# Patient Record
Sex: Female | Born: 1995 | Race: White | Hispanic: No | Marital: Single | Smoking: Never smoker
Health system: Southern US, Community
[De-identification: ages and names within clinical notes are randomized; demographics above are authoritative.]

## PROBLEM LIST (undated history)

## (undated) DIAGNOSIS — F419 Anxiety disorder, unspecified: Secondary | ICD-10-CM

## (undated) HISTORY — PX: CARDIAC SURGERY: SHX584

---
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
## 2016-02-13 ENCOUNTER — Ambulatory Visit (HOSPITAL_COMMUNITY)
Admission: EM | Admit: 2016-02-13 | Discharge: 2016-02-13 | Disposition: A | Discharge status: Home / Self Care | Payer: Medicaid Other | Attending: Emergency Medicine | Admitting: Emergency Medicine

## 2016-02-13 ENCOUNTER — Encounter (HOSPITAL_COMMUNITY): Payer: Self-pay | Admitting: *Deleted

## 2016-02-13 ENCOUNTER — Emergency Department (HOSPITAL_COMMUNITY)
Admission: EM | Admit: 2016-02-13 | Discharge: 2016-02-13 | Disposition: A | Discharge status: Home / Self Care | Payer: Medicaid Other

## 2016-02-13 DIAGNOSIS — R1114 Bilious vomiting: Secondary | ICD-10-CM

## 2016-02-13 DIAGNOSIS — Z789 Other specified health status: Secondary | ICD-10-CM

## 2016-02-13 DIAGNOSIS — E860 Dehydration: Secondary | ICD-10-CM

## 2016-02-13 MED ORDER — SODIUM CHLORIDE 0.9 % IV BOLUS (SEPSIS)
1000.0000 mL | Freq: Once | INTRAVENOUS | Status: AC
  Administered 2016-02-13: 1000 mL via INTRAVENOUS

## 2016-02-13 MED ORDER — ONDANSETRON HCL 4 MG/2ML IJ SOLN
INTRAMUSCULAR | Status: AC
  Filled 2016-02-13: qty 2

## 2016-02-13 MED ORDER — ONDANSETRON HCL 4 MG PO TABS: 4.0000 mg | ORAL_TABLET | Freq: Four times a day (QID) | ORAL | 0 refills | Status: DC | PRN

## 2016-02-13 MED ORDER — ONDANSETRON HCL 4 MG/2ML IJ SOLN
4.0000 mg | Freq: Once | INTRAMUSCULAR | Status: AC
  Administered 2016-02-13: 4 mg via INTRAVENOUS

## 2016-02-13 NOTE — ED Notes (Signed)
Called for triage, still no answer.

## 2016-02-13 NOTE — ED Triage Notes (Signed)
Pt    Reports      Last   5-6    Cocktails    Last  Pm

## 2016-02-13 NOTE — ED Provider Notes (Signed)
CSN: [id number]     Arrival date & time 02/13/16  1043 History   First MD Initiated Contact with Patient 02/13/16 1211     Chief Complaint  Patient presents with  . Emesis   (Consider location/radiation/quality/duration/timing/severity/associated sxs/prior Treatment) 20 year old female brought in by her friend with concern over persistent vomiting since last evening. She consumed possibly 5 to 6 mixed cocktails/alcohol (or more) last night and started vomiting shortly after. She reports she has vomited over 15 times now. She has been shaking and now states her fingers and extremities feel numb. She had a headache last evening and took Excedrin Migraine with relief. No other friends or family members are ill. Denies any fever or diarrhea. No other chronic health issues. Takes no daily medication.    The history is provided by the patient and a friend.    History reviewed. No pertinent past medical history. History reviewed. No pertinent surgical history. History reviewed. No pertinent family history. Social History  Substance Use Topics  . Smoking status: Never Smoker  . Smokeless tobacco: Never Used  . Alcohol use Yes   OB History    No data available     Review of Systems  Constitutional: Positive for appetite change, chills and fatigue. Negative for fever.  HENT: Negative for congestion.   Respiratory: Negative for cough, chest tightness and shortness of breath.   Cardiovascular: Negative for chest pain.  Gastrointestinal: Positive for abdominal pain, nausea and vomiting. Negative for diarrhea.  Genitourinary: Positive for decreased urine volume. Negative for difficulty urinating.  Musculoskeletal: Positive for arthralgias. Negative for neck pain and neck stiffness.  Skin: Negative for rash.  Neurological: Positive for weakness, numbness (in extremities) and headaches. Negative for dizziness and syncope.  Hematological: Negative for adenopathy.    Allergies  Patient has no  known allergies.  Home Medications   Prior to Admission medications   Medication Sig Start Date End Date Taking? Authorizing Provider  ondansetron (ZOFRAN) 4 MG tablet Take 1 tablet (4 mg total) by mouth every 6 (six) hours as needed for nausea or vomiting. 02/13/16   Sudie GrumblingAnn Berry Jawara Latorre, NP   Meds Ordered and Administered this Visit   Medications  sodium chloride 0.9 % bolus 1,000 mL (1,000 mLs Intravenous Given 02/13/16 1251)  ondansetron (ZOFRAN) injection 4 mg (4 mg Intravenous Given 02/13/16 1251)    BP 129/81 (BP Location: Right Arm)   Pulse 89   Temp 97 F (36.1 C)   Resp 16   SpO2 100%  No data found.   Physical Exam  Constitutional: She is oriented to person, place, and time. She appears well-developed and well-nourished. She appears lethargic. She has a sickly appearance.  She is curled up in fetal position due to abdominal cramping and vomiting.   HENT:  Head: Normocephalic and atraumatic.  Nose: Nose normal.  Mouth/Throat: Uvula is midline. Mucous membranes are dry. No posterior oropharyngeal edema or posterior oropharyngeal erythema.  Neck: Normal range of motion. Neck supple.  Cardiovascular: Regular rhythm and normal heart sounds.  Tachycardia present.   Pulmonary/Chest: Effort normal and breath sounds normal. Tachypnea noted.  Abdominal: Soft. Bowel sounds are normal. There is generalized tenderness. There is no rigidity, no rebound, no guarding and no CVA tenderness.  Lymphadenopathy:    She has no cervical adenopathy.  Neurological: She is oriented to person, place, and time. She appears lethargic.  Skin: Capillary refill takes less than 2 seconds. There is pallor.  Psychiatric: She has a normal mood  and affect. Her speech is normal. Thought content normal. She is slowed.    Urgent Care Course   Clinical Course     Procedures (including critical care time)  Labs Review Labs Reviewed - No data to display  Imaging Review No results found.   Visual  Acuity Review  Right Eye Distance:   Left Eye Distance:   Bilateral Distance:    Right Eye Near:   Left Eye Near:    Bilateral Near:         MDM   1. Bilious vomiting with nausea   2. Dehydration   3. Alcohol ingestion    Discussed with friend and patient that she will need IV fluids for rehydration as well as anti-nausea medication. They refuse to go to ER. Will trial 1L 0.9% NaCl bolus and Zofran 4mg  IV here. After 45 minutes, she was feeling better. No longer tachycardic and her peripheral numbness had resolved. She had not vomited in 60 minutes. Recommend slowly increase fluid intake with fluids that have sugar and electrolytes in them. No solid foods today. May continue Zofran 4mg  every 8 hours as needed for nausea and vomiting. Discussed moderation in alcohol consumption. If unable to keep liquids down, go to ER for further evaluation.    Sudie GrumblingAnn Berry Atilla Zollner, NP 02/14/16 1121

## 2016-02-13 NOTE — ED Notes (Signed)
Called for triage x3, no response. 

## 2016-02-13 NOTE — ED Notes (Addendum)
PT instructed to report to ED for uncontrollable vomiting, fever, and /or specific abdominal pain. Pt has not vomited since Zofran was administered.

## 2016-02-13 NOTE — ED Triage Notes (Signed)
Pt  Reports   Vomiting   Cramping     All  Over  Body    continous  Vomiting          Pt  Reports  Fingers  Are      Numb

## 2016-02-13 NOTE — Discharge Instructions (Signed)
You were given IV fluids to help rehydrate you today as well as Zofran to help with vomiting. Recommend slowly increase fluid intake and consume fluids that have sugar, sodium and potassium in them. No solid food today. Make continue Zofran 4mg  every 6 hours as needed for nausea and vomiting. If unable to keep fluids down, go to ER for further evaluation.

## 2016-10-16 ENCOUNTER — Emergency Department (HOSPITAL_COMMUNITY): Payer: Self-pay

## 2016-10-16 ENCOUNTER — Emergency Department (HOSPITAL_COMMUNITY)
Admission: EM | Admit: 2016-10-16 | Discharge: 2016-10-16 | Disposition: A | Discharge status: Home / Self Care | Payer: Self-pay | Attending: Emergency Medicine | Admitting: Emergency Medicine

## 2016-10-16 DIAGNOSIS — M549 Dorsalgia, unspecified: Secondary | ICD-10-CM | POA: Insufficient documentation

## 2016-10-16 DIAGNOSIS — R0789 Other chest pain: Secondary | ICD-10-CM | POA: Insufficient documentation

## 2016-10-16 DIAGNOSIS — R1110 Vomiting, unspecified: Secondary | ICD-10-CM | POA: Insufficient documentation

## 2016-10-16 DIAGNOSIS — R0600 Dyspnea, unspecified: Secondary | ICD-10-CM | POA: Insufficient documentation

## 2016-10-16 DIAGNOSIS — Z79899 Other long term (current) drug therapy: Secondary | ICD-10-CM | POA: Insufficient documentation

## 2016-10-16 LAB — COMPREHENSIVE METABOLIC PANEL
ALT: 13 U/L — ABNORMAL LOW (ref 14–54)
ANION GAP: 13 (ref 5–15)
AST: 29 U/L (ref 15–41)
Albumin: 4.9 g/dL (ref 3.5–5.0)
Alkaline Phosphatase: 56 U/L (ref 38–126)
BILIRUBIN TOTAL: 1.2 mg/dL (ref 0.3–1.2)
BUN: 7 mg/dL (ref 6–20)
CO2: 17 mmol/L — ABNORMAL LOW (ref 22–32)
Calcium: 9.4 mg/dL (ref 8.9–10.3)
Chloride: 107 mmol/L (ref 101–111)
Creatinine, Ser: 0.58 mg/dL (ref 0.44–1.00)
GFR calc non Af Amer: >60 mL/min (ref >60)
GLUCOSE: 107 mg/dL — AB (ref 65–99)
POTASSIUM: 3.4 mmol/L — AB (ref 3.5–5.1)
Sodium: 137 mmol/L (ref 135–145)
TOTAL PROTEIN: 7.9 g/dL (ref 6.5–8.1)

## 2016-10-16 LAB — CBC WITH DIFFERENTIAL/PLATELET
BASOS ABS: 0 10*3/uL (ref 0.0–0.1)
Basophils Relative: 0 %
Eosinophils Absolute: 0 10*3/uL (ref 0.0–0.7)
Eosinophils Relative: 0 %
HEMATOCRIT: 39.3 % (ref 36.0–46.0)
HEMOGLOBIN: 14.1 g/dL (ref 12.0–15.0)
Lymphocytes Relative: 5 %
Lymphs Abs: 0.6 10*3/uL — ABNORMAL LOW (ref 0.7–4.0)
MCH: 29.4 pg (ref 26.0–34.0)
MCHC: 35.9 g/dL (ref 30.0–36.0)
MCV: 81.9 fL (ref 78.0–100.0)
Monocytes Absolute: 0.4 10*3/uL (ref 0.1–1.0)
Monocytes Relative: 4 %
NEUTROS ABS: 11.1 10*3/uL — AB (ref 1.7–7.7)
NEUTROS PCT: 91 %
PLATELETS: 214 10*3/uL (ref 150–400)
RBC: 4.8 MIL/uL (ref 3.87–5.11)
RDW: 13.1 % (ref 11.5–15.5)
WBC: 12.2 10*3/uL — AB (ref 4.0–10.5)

## 2016-10-16 LAB — URINALYSIS, COMPLETE (UACMP) WITH MICROSCOPIC
Bacteria, UA: NONE SEEN
Bilirubin Urine: NEGATIVE
GLUCOSE, UA: NEGATIVE mg/dL
Ketones, ur: 20 mg/dL — AB
Leukocytes, UA: NEGATIVE
NITRITE: NEGATIVE
PH: 8 (ref 5.0–8.0)
PROTEIN: NEGATIVE mg/dL
SPECIFIC GRAVITY, URINE: 1.024 (ref 1.005–1.030)
WBC UA: NONE SEEN WBC/hpf (ref 0–5)

## 2016-10-16 LAB — TROPONIN I

## 2016-10-16 LAB — LIPASE, BLOOD: LIPASE: 24 U/L (ref 11–51)

## 2016-10-16 LAB — CBG MONITORING, ED: GLUCOSE-CAPILLARY: 105 mg/dL — AB (ref 65–99)

## 2016-10-16 LAB — LACTIC ACID, PLASMA: Lactic Acid, Venous: 4 mmol/L (ref 0.5–1.9)

## 2016-10-16 LAB — I-STAT BETA HCG BLOOD, ED (MC, WL, AP ONLY): I-stat hCG, quantitative: <5 m[IU]/mL (ref <5)

## 2016-10-16 MED ORDER — SODIUM CHLORIDE 0.9 % IV BOLUS (SEPSIS)
1000.0000 mL | Freq: Once | INTRAVENOUS | Status: AC
  Administered 2016-10-16: 1000 mL via INTRAVENOUS

## 2016-10-16 MED ORDER — PROMETHAZINE HCL 25 MG PO TABS: 25.0000 mg | ORAL_TABLET | Freq: Four times a day (QID) | ORAL | 0 refills | Status: DC | PRN

## 2016-10-16 MED ORDER — IOPAMIDOL (ISOVUE-300) INJECTION 61%
INTRAVENOUS | Status: AC
  Administered 2016-10-16: 75 mL via INTRAVENOUS
  Filled 2016-10-16: qty 75

## 2016-10-16 MED ORDER — PROCHLORPERAZINE EDISYLATE 5 MG/ML IJ SOLN
10.0000 mg | Freq: Once | INTRAMUSCULAR | Status: AC
  Administered 2016-10-16: 10 mg via INTRAVENOUS
  Filled 2016-10-16: qty 2

## 2016-10-16 MED ORDER — FENTANYL CITRATE (PF) 100 MCG/2ML IJ SOLN
50.0000 ug | Freq: Once | INTRAMUSCULAR | Status: AC
  Administered 2016-10-16: 50 ug via INTRAVENOUS
  Filled 2016-10-16: qty 2

## 2016-10-16 MED ORDER — SODIUM CHLORIDE 0.9 % IV BOLUS (SEPSIS): 1000.0000 mL | Freq: Once | INTRAVENOUS | Status: DC

## 2016-10-16 MED ORDER — IOPAMIDOL (ISOVUE-300) INJECTION 61%
INTRAVENOUS | Status: AC
  Filled 2016-10-16: qty 30

## 2016-10-16 NOTE — ED Notes (Signed)
Meal and po challenge complete

## 2016-10-16 NOTE — ED Notes (Signed)
PT IN CT

## 2016-10-16 NOTE — ED Notes (Signed)
Patient transported to x-ray. ?

## 2016-10-16 NOTE — ED Notes (Signed)
AWARE OF NEED FOR URINE 

## 2016-10-16 NOTE — ED Triage Notes (Signed)
Per mother, patient started vomiting this morning and started having chest and back pain and now she is short of breath. She has vomiting 30+ times today.

## 2016-10-16 NOTE — ED Notes (Signed)
Patient aware of urine specimen.

## 2016-10-16 NOTE — ED Notes (Signed)
ED Provider at bedside. 

## 2016-10-16 NOTE — ED Provider Notes (Signed)
[facility]-EMERGENCY DEPT Provider Note   CSN: [id number] Arrival date & time: 10/16/16  1614     History   Chief Complaint Chief Complaint  Patient presents with  . Shortness of Breath  . Chest Pain  . Back Pain  . Emesis    HPI Janasha A Lawernce IonCranford is a 21 y.o. female.  HPI   21 year old female without a known past medical history aside from some type of heart surgery when she was younger. She started having nausea and vomiting earlier today. This progressed throughout the day with one episode of diarrhea. After multiple episodes of vomiting started having chest pain and back pain with shortness of breath. No fevers. She did eat out last night and had a couple drinks but was not intoxicated as mom was with her all night. Her boyfriend ate the same proceeded and has not been sick. She's not been sick like this before.  No urinary symptoms, no other drugs. No other associated or modifying symptoms.  No past medical history on file.  There are no active problems to display for this patient.   No past surgical history on file.  OB History    No data available       Home Medications    Prior to Admission medications   Medication Sig Start Date End Date Taking? Authorizing Provider  cetirizine (ZYRTEC) 10 MG tablet Take 10 mg by mouth daily as needed for allergies.   Yes [provider]  diphenhydrAMINE (BENADRYL) 25 MG tablet Take 25-50 mg by mouth every 6 (six) hours as needed for allergies.   Yes [provider]  ondansetron (ZOFRAN) 4 MG tablet Take 1 tablet (4 mg total) by mouth every 6 (six) hours as needed for nausea or vomiting. 02/13/16  Yes Amyot, Ali LoweAnn Berry, NP  promethazine (PHENERGAN) 25 MG tablet Take 1 tablet (25 mg total) by mouth every 6 (six) hours as needed for nausea or vomiting. 10/16/16   Bracen Schum, Barbara CowerJason, MD    Family History No family history on file.  Social History Social History  Substance Use Topics  . Smoking status: Never Smoker    . Smokeless tobacco: Never Used  . Alcohol use Yes     Allergies   Patient has no known allergies.   Review of Systems Review of Systems  All other systems reviewed and are negative.    Physical Exam Updated Vital Signs BP 107/61   Pulse 73   Temp 97.8 F (36.6 C) (Oral)   Resp 15   LMP 10/16/2016   SpO2 97%   Physical Exam  Constitutional: She is oriented to person, place, and time. She appears well-developed and well-nourished. She appears distressed.  HENT:  Head: Normocephalic and atraumatic.  Eyes: Conjunctivae and EOM are normal.  Neck: Normal range of motion.  Cardiovascular: Normal rate and regular rhythm.   Pulmonary/Chest: No stridor. Tachypnea noted. No respiratory distress. She has no wheezes. She has no rales.  Abdominal: Soft. She exhibits no distension.  Neurological: She is alert and oriented to person, place, and time.  Skin: Skin is warm and dry.  Nursing note and vitals reviewed.    ED Treatments / Results  Labs (all labs ordered are listed, but only abnormal results are displayed) Labs Reviewed  COMPREHENSIVE METABOLIC PANEL - Abnormal; Notable for the following:       Result Value   Potassium 3.4 (*)    CO2 17 (*)    Glucose, Bld 107 (*)    ALT  13 (*)    All other components within normal limits  CBC WITH DIFFERENTIAL/PLATELET - Abnormal; Notable for the following:    WBC 12.2 (*)    Neutro Abs 11.1 (*)    Lymphs Abs 0.6 (*)    All other components within normal limits  URINALYSIS, COMPLETE (UACMP) WITH MICROSCOPIC - Abnormal; Notable for the following:    APPearance CLOUDY (*)    Hgb urine dipstick SMALL (*)    Ketones, ur 20 (*)    Squamous Epithelial / LPF 0-5 (*)    All other components within normal limits  LACTIC ACID, PLASMA - Abnormal; Notable for the following:    Lactic Acid, Venous 4.0 (*)    All other components within normal limits  CBG MONITORING, ED - Abnormal; Notable for the following:    Glucose-Capillary 105  (*)    All other components within normal limits  LIPASE, BLOOD  TROPONIN I  I-STAT BETA HCG BLOOD, ED ([facility], [facility], [facility] ONLY)    EKG  EKG Interpretation  Date/Time:  Tuesday October 16 2016 16:40:23 EDT Ventricular Rate:  77 PR Interval:    QRS Duration: 96 QT Interval:  431 QTC Calculation: 488 R Axis:   74 Text Interpretation:  Sinus rhythm Borderline repolarization abnormality Borderline prolonged QT interval No old tracing to compare Confirmed by Marily Memos [phone]) on 10/16/2016 5:38:29 PM       Radiology Dg Chest 2 View  Result Date: 10/16/2016 CLINICAL DATA:  21 year old female with chest pain, abdomen pain, back pain and shortness of breath. EXAM: CHEST  2 VIEW COMPARISON:  02/18/2007 and earlier. FINDINGS: Semi upright AP and lateral views of the chest. Large lung volumes which could reflect good inspiratory effort or hyperinflation. Mediastinal contours are stable and within normal limits. Visualized tracheal air column is within normal limits. No pneumothorax, pulmonary edema, pleural effusion or confluent pulmonary opacity. Mild dextroconvex thoracic curvature. No acute osseous abnormality identified. Negative visible bowel gas pattern. No pneumoperitoneum identified. IMPRESSION: Negative aside from suggestion of pulmonary hyperinflation. Electronically Signed   By: Odessa Fleming M.D.   On: 10/16/2016 18:08   Ct Abdomen Pelvis W Contrast  Result Date: 10/16/2016 CLINICAL DATA:  Multiple episodes of vomiting today. Chest and back pain and shortness of breath. EXAM: CT ABDOMEN AND PELVIS WITH CONTRAST TECHNIQUE: Multidetector CT imaging of the abdomen and pelvis was performed using the standard protocol following bolus administration of intravenous contrast. CONTRAST:  75 cc ISOVUE-300 IOPAMIDOL (ISOVUE-300) INJECTION 61% COMPARISON:  Pelvic ultrasound dated 12/18/2011. FINDINGS: Lower chest: Unremarkable. Hepatobiliary: No focal liver abnormality is seen. No gallstones, gallbladder wall  thickening, or biliary dilatation. Pancreas: Unremarkable. No pancreatic ductal dilatation or surrounding inflammatory changes. Spleen: Normal in size without focal abnormality. Adrenals/Urinary Tract: Adrenal glands are unremarkable. Kidneys are normal, without renal calculi, focal lesion, or hydronephrosis. Bladder is unremarkable. Stomach/Bowel: Stomach is within normal limits. Appendix appears normal. No evidence of bowel wall thickening, distention, or inflammatory changes. Vascular/Lymphatic: No significant vascular findings are present. No enlarged abdominal or pelvic lymph nodes. Reproductive: Uterus and bilateral adnexa are unremarkable. Tampon in the vagina. Other: Trace amount of free peritoneal fluid, within normal limits of physiological fluid. Musculoskeletal: Mild levoconvex thoracolumbar scoliosis. IMPRESSION: No acute abnormality. Electronically Signed   By: Beckie Salts M.D.   On: 10/16/2016 19:38    Procedures Procedures (including critical care time)  Medications Ordered in ED Medications  iopamidol (ISOVUE-300) 61 % injection (not administered)  sodium chloride 0.9 % bolus 1,000 mL (  0 mLs Intravenous Stopped 10/16/16 1925)  fentaNYL (SUBLIMAZE) injection 50 mcg (50 mcg Intravenous Given 10/16/16 1832)  prochlorperazine (COMPAZINE) injection 10 mg (10 mg Intravenous Given 10/16/16 1827)  sodium chloride 0.9 % bolus 1,000 mL (0 mLs Intravenous Stopped 10/16/16 2152)  iopamidol (ISOVUE-300) 61 % injection (75 mLs Intravenous Contrast Given 10/16/16 1920)     Initial Impression / Assessment and Plan / ED Course  I have reviewed the triage vital signs and the nursing notes.  Pertinent labs & imaging results that were available during my care of the patient were reviewed by me and considered in my medical decision making (see chart for details).   Suspect likely gastroenteritis. However with any symptoms of chest pain and back pain we'll get x-ray EKG a couple of the labs while  treating her symptoms.   Clinical Course as of Oct 16 2220  Tue Oct 16, 2016  1905 Elevated WBC/lactic acid and normal CXR/lipase/ecg. Appendicitis? Gastroenteritis w/ dehydration? Colitis? Will CT  [JM]  1911 Reeval and patient sleeping. Increased pain. Blood pressure slightly low. Will give fluids. Awaiting CT.  [JM]  2015 Ct normal. Patient improved symptoms. Has an appetite, will PO challenge.  [JM]    Clinical Course User Index [JM] Deshay Kirstein, Barbara CowerJason, MD     Symptoms all resolved suspect gastroenteritis as the cause viral most likely. We'll DC on Phenergan with strict return precautions were  Final Clinical Impressions(s) / ED Diagnoses   Final diagnoses:  Dyspnea, unspecified type  Non-intractable vomiting, presence of nausea not specified, unspecified vomiting type    New Prescriptions New Prescriptions   PROMETHAZINE (PHENERGAN) 25 MG TABLET    Take 1 tablet (25 mg total) by mouth every 6 (six) hours as needed for nausea or vomiting.     Marily MemosMesner, Katerina Zurn, MD 10/16/16 2223

## 2017-03-03 ENCOUNTER — Emergency Department (HOSPITAL_COMMUNITY)
Admission: EM | Admit: 2017-03-03 | Discharge: 2017-03-03 | Disposition: A | Discharge status: Home / Self Care | Payer: Self-pay | Attending: Emergency Medicine | Admitting: Emergency Medicine

## 2017-03-03 ENCOUNTER — Encounter (HOSPITAL_COMMUNITY): Payer: Self-pay | Admitting: Emergency Medicine

## 2017-03-03 ENCOUNTER — Emergency Department (HOSPITAL_COMMUNITY): Payer: Self-pay

## 2017-03-03 DIAGNOSIS — R1110 Vomiting, unspecified: Secondary | ICD-10-CM | POA: Insufficient documentation

## 2017-03-03 DIAGNOSIS — R0602 Shortness of breath: Secondary | ICD-10-CM | POA: Insufficient documentation

## 2017-03-03 DIAGNOSIS — F1210 Cannabis abuse, uncomplicated: Secondary | ICD-10-CM | POA: Insufficient documentation

## 2017-03-03 DIAGNOSIS — R112 Nausea with vomiting, unspecified: Secondary | ICD-10-CM | POA: Insufficient documentation

## 2017-03-03 LAB — URINALYSIS, ROUTINE W REFLEX MICROSCOPIC
BILIRUBIN URINE: NEGATIVE
Glucose, UA: NEGATIVE mg/dL
HGB URINE DIPSTICK: NEGATIVE
KETONES UR: 80 mg/dL — AB
Leukocytes, UA: NEGATIVE
NITRITE: NEGATIVE
PROTEIN: NEGATIVE mg/dL
SPECIFIC GRAVITY, URINE: 1.018 (ref 1.005–1.030)
pH: 7 (ref 5.0–8.0)

## 2017-03-03 LAB — BLOOD GAS, VENOUS
ACID-BASE DEFICIT: 6.3 mmol/L — AB (ref 0.0–2.0)
BICARBONATE: 15 mmol/L — AB (ref 20.0–28.0)
FIO2: 21
O2 Saturation: 73.9 %
PATIENT TEMPERATURE: 98.6
PO2 VEN: 39.5 mmHg (ref 32.0–45.0)
pCO2, Ven: 19.9 mmHg — CL (ref 44.0–60.0)
pH, Ven: 7.491 — ABNORMAL HIGH (ref 7.250–7.430)

## 2017-03-03 LAB — COMPREHENSIVE METABOLIC PANEL
ALBUMIN: 5.2 g/dL — AB (ref 3.5–5.0)
ALT: 16 U/L (ref 14–54)
AST: 31 U/L (ref 15–41)
Alkaline Phosphatase: 62 U/L (ref 38–126)
Anion gap: 20 — ABNORMAL HIGH (ref 5–15)
BILIRUBIN TOTAL: 0.9 mg/dL (ref 0.3–1.2)
BUN: 9 mg/dL (ref 6–20)
CO2: 18 mmol/L — AB (ref 22–32)
Calcium: 9.5 mg/dL (ref 8.9–10.3)
Chloride: 102 mmol/L (ref 101–111)
Creatinine, Ser: 0.69 mg/dL (ref 0.44–1.00)
GFR calc Af Amer: >60 mL/min (ref >60)
GFR calc non Af Amer: >60 mL/min (ref >60)
GLUCOSE: 171 mg/dL — AB (ref 65–99)
POTASSIUM: 3.4 mmol/L — AB (ref 3.5–5.1)
SODIUM: 140 mmol/L (ref 135–145)
TOTAL PROTEIN: 8.2 g/dL — AB (ref 6.5–8.1)

## 2017-03-03 LAB — RAPID URINE DRUG SCREEN, HOSP PERFORMED
AMPHETAMINES: NOT DETECTED
BARBITURATES: NOT DETECTED
BENZODIAZEPINES: NOT DETECTED
Cocaine: NOT DETECTED
Opiates: NOT DETECTED
TETRAHYDROCANNABINOL: POSITIVE — AB

## 2017-03-03 LAB — CBC
HCT: 42.4 % (ref 36.0–46.0)
HEMOGLOBIN: 14.6 g/dL (ref 12.0–15.0)
MCH: 30.2 pg (ref 26.0–34.0)
MCHC: 34.4 g/dL (ref 30.0–36.0)
MCV: 87.8 fL (ref 78.0–100.0)
Platelets: 275 10*3/uL (ref 150–400)
RBC: 4.83 MIL/uL (ref 3.87–5.11)
RDW: 13.8 % (ref 11.5–15.5)
WBC: 15.3 10*3/uL — AB (ref 4.0–10.5)

## 2017-03-03 LAB — I-STAT TROPONIN, ED: Troponin i, poc: 0 ng/mL (ref 0.00–0.08)

## 2017-03-03 LAB — I-STAT BETA HCG BLOOD, ED (MC, WL, AP ONLY)

## 2017-03-03 MED ORDER — SODIUM CHLORIDE 0.9 % IV BOLUS (SEPSIS)
1000.0000 mL | Freq: Once | INTRAVENOUS | Status: AC
  Administered 2017-03-03: 1000 mL via INTRAVENOUS

## 2017-03-03 MED ORDER — CAPSAICIN 0.075 % EX CREA
TOPICAL_CREAM | Freq: Two times a day (BID) | CUTANEOUS | Status: DC
  Filled 2017-03-03: qty 60

## 2017-03-03 MED ORDER — ONDANSETRON HCL 4 MG/2ML IJ SOLN
4.0000 mg | Freq: Once | INTRAMUSCULAR | Status: AC
  Administered 2017-03-03: 4 mg via INTRAVENOUS
  Filled 2017-03-03: qty 2

## 2017-03-03 MED ORDER — HALOPERIDOL LACTATE 5 MG/ML IJ SOLN
5.0000 mg | Freq: Once | INTRAMUSCULAR | Status: AC
  Administered 2017-03-03: 5 mg via INTRAVENOUS
  Filled 2017-03-03: qty 1

## 2017-03-03 MED ORDER — DICYCLOMINE HCL 10 MG/ML IM SOLN
20.0000 mg | Freq: Once | INTRAMUSCULAR | Status: AC
  Administered 2017-03-03: 20 mg via INTRAMUSCULAR
  Filled 2017-03-03: qty 2

## 2017-03-03 NOTE — ED Provider Notes (Signed)
[facility]  [facility] at shift change with Urine and UDS pending.   In brief, this patient is a [age] F who presented to the emergency department with generalized chest pain, abdominal pain patient does endorse marijuana use.  Patient had some improvement in pain after fluids, Zofran and Bentyl.  No chest pain but still with some mild abdominal pain.  Patient was still having some vomiting was given Haldol for vomiting.  Patient had improvement after indications.  PLAN: If urine drug screen is positive for drugs and patient has a reassuring exam, plan to reevaluate and p.o. challenge patient.  Patient stable, she can be discharged home with primary care follow-up.  Urine drug screen is negative and patient still endorsing pain and is symptomatic, potentially may need a CT abdomen pelvis for further evaluation.  Repeat vitals are stable.  Reevaluation of patient.  She reports feeling much better.  She states abdomen pain has completely resolved.  Abdominal exam is unremarkable.  No suspicion for appendicitis or diverticulitis at this time.  No peritoneal signs.  Plan to p.o. challenge patient in the department.  Patient tolerated p.o. without any difficulty.  Vitals stable.  Patient states that she feels better and is ready to go home. Patient had ample opportunity for questions and discussion. All patient's questions were answered with full understanding. Strict return precautions discussed. Patient expresses understanding and agreement to plan.     1. Non-intractable vomiting with nausea, unspecified vomiting type       Maxwell CaulLayden, Lindsey A, PA-C 03/03/17 1809    Vanetta MuldersZackowski, Scott, MD 03/04/17 [phone]

## 2017-03-03 NOTE — ED Notes (Signed)
Pt made aware of need for urine specimen. Unable to void at this time.

## 2017-03-03 NOTE — ED Triage Notes (Signed)
Patient c/o central and left chest pain, SOB, n/v that started today.  Patient has cough that sounds congested per SO but unsure if getting thing up when coughs.  Patient not answering questions at this time.

## 2017-03-03 NOTE — Discharge Instructions (Signed)
Make sure you drink plenty of fluids and staying hydrated.  Follow-up with your primary care doctor next 2-4 days for further evaluation.  Return to the Emergency Department immediately if you experience any worsening abdominal pain, fever, persistent nausea and vomiting, inability keep any food down, pain with urination, blood in your urine or any other worsening or concerning symptoms.

## 2017-03-03 NOTE — ED Notes (Signed)
Pt observed to be rolling around in bed, moaning and dry heaving. PA student at bedside

## 2017-03-03 NOTE — ED Provider Notes (Signed)
[facility]-EMERGENCY DEPT Provider Note   CSN: [id number] Arrival date & time: 03/03/17  1247     History   Chief Complaint Chief Complaint  Patient presents with  . Chest Pain  . Shortness of Breath  . Emesis    HPI Rebecca Nielsen is a 21 y.o. female.  The history is provided by the patient and medical records. No language interpreter was used.  Chest Pain   Associated symptoms include abdominal pain, nausea, shortness of breath and vomiting. Pertinent negatives include no cough and no palpitations.  Shortness of Breath  Associated symptoms include chest pain, vomiting and abdominal pain. Pertinent negatives include no cough and no leg swelling.  Emesis   Associated symptoms include abdominal pain. Pertinent negatives include no cough and no diarrhea.   Rebecca Nielsen is a 21 y.o. female  with no known PMH who presents to the Emergency Department complaining of central chest pain, upper abdominal pain, nausea and vomiting which began this morning. Patient states that she woke up in her usual state of health. She ate grits and tolerated this fine. About an hour later, she acutely developed the above symptoms.  No medications taken prior to arrival for symptoms.  No alleviating or aggravating factors noted.  Patient states that she has had similar episodes 2 or 3 times in the past which improved with medication management.  One time she went to the urgent care where she was given Zofran, the other time she went to the emergency department.  She has been sure why these flares have occurred in the past.  She does endorse marijuana use.  Denies any other illicit drugs.  She does drink.  She had 2 shots last night which will not typically make her sick.  No alcohol use today.  No fever or chills.  No constipation, diarrhea or blood in the stool.  No sick contacts.  No recent travel or camping.  No recent hospitalizations/immobilizations.  Not on oral  contraceptive pills.  3 of clotting disorder or previous PE/PE.  No history of hypertension, hyperlipidemia, heart disease or diabetes. No family cardiac history.   History reviewed. No pertinent past medical history.  There are no active problems to display for this patient.   Past Surgical History:  Procedure Laterality Date  . CARDIAC SURGERY      OB History    No data available       Home Medications    Prior to Admission medications   Not on File    Family History No family history on file.  Social History Social History   Tobacco Use  . Smoking status: Never Smoker  . Smokeless tobacco: Never Used  Substance Use Topics  . Alcohol use: Yes  . Drug use: Not on file     Allergies   Patient has no known allergies.   Review of Systems Review of Systems  Respiratory: Positive for shortness of breath. Negative for cough.   Cardiovascular: Positive for chest pain. Negative for palpitations and leg swelling.  Gastrointestinal: Positive for abdominal pain, nausea and vomiting. Negative for constipation and diarrhea.  All other systems reviewed and are negative.    Physical Exam Updated Vital Signs BP 127/76 (BP Location: Right Arm)   Pulse 89   Temp (!) 97.5 F (36.4 C) (Oral)   Resp 20   Ht 5\' 2"  (1.575 m)   Wt 49 kg (108 lb)   LMP 02/05/2017   SpO2 100%  BMI 19.75 kg/m   Physical Exam  Constitutional: She is oriented to person, place, and time. She appears well-developed and well-nourished. No distress.  HENT:  Head: Normocephalic and atraumatic.  Cardiovascular: Normal rate, regular rhythm and normal heart sounds.  No murmur heard. Pulmonary/Chest: Breath sounds normal. Tachypnea noted. No respiratory distress.  Abdominal: Soft. She exhibits no distension.  Diffuse abdominal tenderness without focality. No rebound or guarding. Negative Murphy's.  Musculoskeletal: She exhibits no edema.  Neurological: She is alert and oriented to person,  place, and time.  Skin: Skin is warm and dry.  Nursing note and vitals reviewed.     ED Treatments / Results  Labs (all labs ordered are listed, but only abnormal results are displayed) Labs Reviewed  CBC - Abnormal; Notable for the following components:      Result Value   WBC 15.3 (*)    All other components within normal limits  COMPREHENSIVE METABOLIC PANEL - Abnormal; Notable for the following components:   Potassium 3.4 (*)    CO2 18 (*)    Glucose, Bld 171 (*)    Total Protein 8.2 (*)    Albumin 5.2 (*)    Anion gap 20 (*)    All other components within normal limits  RAPID URINE DRUG SCREEN, HOSP PERFORMED  URINALYSIS, ROUTINE W REFLEX MICROSCOPIC  BLOOD GAS, VENOUS  I-STAT TROPONIN, ED  I-STAT BETA HCG BLOOD, ED ([facility], [facility], [facility] ONLY)    EKG  EKG Interpretation  Date/Time:  Sunday March 03 2017 13:06:04 EST Ventricular Rate:  80 PR Interval:    QRS Duration: 89 QT Interval:  405 QTC Calculation: 468 R Axis:   73 Text Interpretation:  Sinus rhythm Borderline short PR interval Confirmed by Vanetta Mulders [phone]) on 03/03/2017 3:04:38 PM       Radiology Dg Abdomen Acute W/chest  Result Date: 03/03/2017 CLINICAL DATA:  Chest pain and shortness of Breath EXAM: DG ABDOMEN ACUTE W/ 1V CHEST COMPARISON:  10/16/2016 FINDINGS: Cardiac shadow is within normal limits. The lungs are well aerated bilaterally. No focal infiltrate or sizable effusion is seen. No acute bony abnormality is noted. Very gentle scoliosis of the thoracolumbar spine is noted. No free air is noted. Scattered large and small bowel gas is noted. No abnormal mass or abnormal calcifications are noted. IMPRESSION: No acute abnormality noted. Electronically Signed   By: Alcide Clever M.D.   On: 03/03/2017 14:29    Procedures Procedures (including critical care time)  Medications Ordered in ED Medications  capsicum (ZOSTRIX) 0.075 % cream (not administered)  sodium chloride 0.9 % bolus 1,000 mL (0  mLs Intravenous Stopped 03/03/17 1532)  ondansetron (ZOFRAN) injection 4 mg (4 mg Intravenous Given 03/03/17 1346)  dicyclomine (BENTYL) injection 20 mg (20 mg Intramuscular Given 03/03/17 1346)  haloperidol lactate (HALDOL) injection 5 mg (5 mg Intravenous Given 03/03/17 1529)  sodium chloride 0.9 % bolus 1,000 mL (0 mLs Intravenous Stopped 03/03/17 1601)     Initial Impression / Assessment and Plan / ED Course  I have reviewed the triage vital signs and the nursing notes.  Pertinent labs & imaging results that were available during my care of the patient were reviewed by me and considered in my medical decision making (see chart for details).    Silvia A Biever is a 21 y.o. female who presents to ED for chest pain, abdominal pain, nausea and vomiting which began today. No known medical history, but per chart review, does look like she has been seen  by urgent care (Nov. 2017) and in ED (July 2018) for similar sxs. EKG sinus rhythm. Troponin negative. Chest and abdominal plain films negative. PERC negative. Heart score of 1. Doubt ACS or PE. Labs reviewed. CO2 of 18 noted. During evaluation, patient dry heaving and hyperventilating. She is tachypneic. Likely cause of co2. Will check VBG to assess ph. -- VBG reassuring. Feels improved after 1L fluids, bentyl and zofran however still complaining of abdominal pain and does not feel able to tolerate PO yet. Will give haldol and capsaicin cream. Abdominal exam improved with epigastric tenderness but no peritoneal signs. Chest pain has resolved. UA and UDS pending. Patient does endorse marijuana use. ? If this may be contributory to patient's constellation of symptoms.   UA and UDS pending at shift change. Care assumed by oncoming provider PA Layden . Case discussed, plan agreed upon. Will follow up on pending UA and UDS. Will re-evaluate patient following medication administration / PO challenge. If urine reassuring and able to tolerate PO, likely  discharge to home.    Final Clinical Impressions(s) / ED Diagnoses   Final diagnoses:  None    ED Discharge Orders    None       Varonica Siharath, Chase PicketJaime Pilcher, PA-C 03/03/17 1610    Vanetta MuldersZackowski, Scott, MD 03/04/17 [phone]

## 2017-03-03 NOTE — ED Notes (Signed)
Per Dr Jodelle GrossZakowski, pt QT is 483 at this time but has been borderline before. OK to give Haldol for intractable vomiting

## 2017-03-27 ENCOUNTER — Emergency Department (HOSPITAL_COMMUNITY): Admission: EM | Admit: 2017-03-27 | Discharge: 2017-03-27 | Discharge status: Absent without leave | Payer: Self-pay

## 2017-03-28 ENCOUNTER — Emergency Department (HOSPITAL_COMMUNITY): Payer: Self-pay

## 2017-03-28 ENCOUNTER — Other Ambulatory Visit: Payer: Self-pay

## 2017-03-28 ENCOUNTER — Emergency Department (HOSPITAL_COMMUNITY)
Admission: EM | Admit: 2017-03-28 | Discharge: 2017-03-28 | Disposition: A | Discharge status: Home / Self Care | Payer: Self-pay | Attending: Emergency Medicine | Admitting: Emergency Medicine

## 2017-03-28 ENCOUNTER — Encounter (HOSPITAL_COMMUNITY): Payer: Self-pay | Admitting: Emergency Medicine

## 2017-03-28 DIAGNOSIS — S93602A Unspecified sprain of left foot, initial encounter: Secondary | ICD-10-CM

## 2017-03-28 DIAGNOSIS — X509XXA Other and unspecified overexertion or strenuous movements or postures, initial encounter: Secondary | ICD-10-CM | POA: Insufficient documentation

## 2017-03-28 DIAGNOSIS — Y9301 Activity, walking, marching and hiking: Secondary | ICD-10-CM | POA: Insufficient documentation

## 2017-03-28 DIAGNOSIS — Y92480 Sidewalk as the place of occurrence of the external cause: Secondary | ICD-10-CM | POA: Insufficient documentation

## 2017-03-28 DIAGNOSIS — Y999 Unspecified external cause status: Secondary | ICD-10-CM | POA: Insufficient documentation

## 2017-03-28 NOTE — ED Provider Notes (Signed)
[facility]-EMERGENCY DEPT Provider Note   CSN: [id number] Arrival date & time: 03/28/17  [id number]     History   Chief Complaint Chief Complaint  Patient presents with  . Foot Injury  . Cough    HPI Nashya A Lawernce IonCranford is a 22 y.o. female. Chief appearance foot injury  HPI 22 year old female.  She inverted her left foot when she miss stepped off of a curb. Has pain and swelling of her foot and cannot bear weight. She works as a Child psychotherapistwaitress.  History reviewed. No pertinent past medical history.  There are no active problems to display for this patient.   Past Surgical History:  Procedure Laterality Date  . CARDIAC SURGERY      OB History    No data available       Home Medications    Prior to Admission medications   Medication Sig Start Date End Date Taking? Authorizing Provider  fluticasone (FLONASE) 50 MCG/ACT nasal spray Place 1 spray into both nostrils daily.   Yes [provider]    Family History History reviewed. No pertinent family history.  Social History Social History   Tobacco Use  . Smoking status: Never Smoker  . Smokeless tobacco: Never Used  Substance Use Topics  . Alcohol use: Yes  . Drug use: Not on file     Allergies   Patient has no known allergies.   Review of Systems Review of Systems  Musculoskeletal: Positive for arthralgias, gait problem and joint swelling.     Physical Exam Updated Vital Signs BP (!) 123/95   Pulse 77   Temp 97.7 F (36.5 C) (Oral)   Resp 20   Wt 48.1 kg (106 lb)   LMP 03/24/2017 (Exact Date)   SpO2 100%   BMI 19.39 kg/m   Physical Exam  Constitutional: She is oriented to person, place, and time. She appears well-developed and well-nourished. No distress.  HENT:  Head: Normocephalic.  Eyes: Conjunctivae are normal. Pupils are equal, round, and reactive to light. No scleral icterus.  Neck: Normal range of motion. Neck supple. No thyromegaly present.  Cardiovascular:  Normal rate and regular rhythm. Exam reveals no gallop and no friction rub.  No murmur heard. Pulmonary/Chest: Effort normal and breath sounds normal. No respiratory distress. She has no wheezes. She has no rales.  Abdominal: Soft. Bowel sounds are normal. She exhibits no distension. There is no tenderness. There is no rebound.  Musculoskeletal: Normal range of motion.  Soft tissue swelling and tenderness over the area of the base of the fifth metatarsal. No bruising or ecchymosis. Tenderness and pain extend up to the anterior aspect of the left lateral malleolus.  Neurological: She is alert and oriented to person, place, and time.  Skin: Skin is warm and dry. No rash noted.  Psychiatric: She has a normal mood and affect. Her behavior is normal.     ED Treatments / Results  Labs (all labs ordered are listed, but only abnormal results are displayed) Labs Reviewed - No data to display  EKG  EKG Interpretation None       Radiology Dg Ankle Complete Left  Result Date: 03/28/2017 CLINICAL DATA:  Foot and ankle pain EXAM: LEFT ANKLE COMPLETE - 3+ VIEW COMPARISON:  None. FINDINGS: There is no evidence of fracture, dislocation, or joint effusion. There is no evidence of arthropathy or other focal bone abnormality. Soft tissues are unremarkable. IMPRESSION: Negative. Electronically Signed   By: Charlett NoseKevin  Dover M.D.   On:  03/28/2017 10:28    Procedures Procedures (including critical care time)  Medications Ordered in ED Medications - No data to display   Initial Impression / Assessment and Plan / ED Course  I have reviewed the triage vital signs and the nursing notes.  Pertinent labs & imaging results that were available during my care of the patient were reviewed by me and considered in my medical decision making (see chart for details).    X-rays negative. Likely Peroneus brevis sprain. Placed in a walking boot/Cam Walker. Crutches as needed. Ice and elevate when needed. Tylenol  Motrin. Work restrictions.  Final Clinical Impressions(s) / ED Diagnoses   Final diagnoses:  Sprain of left foot, initial encounter    ED Discharge Orders    None       Rolland Porter, MD 03/28/17 1102

## 2017-03-28 NOTE — ED Triage Notes (Addendum)
Pt reports that she turned her l/foot outward when she missed a step at home. Injury was 2 days ago.  Increased pain and swelling . Unable to bare weight at this time. Tx with ice, elevate and medicated with Aleve at 2000 last night with some relief Pt has moist cough x 2 weeks

## 2017-03-28 NOTE — Discharge Instructions (Signed)
Can weight-bear as tolerated.   If still painful limping wearing boot, use crutches until walking without limp or pain Ice, wear Ace wrap, and elevate the foot whenever possible. Motrin and/or Tylenol for pain.

## 2018-09-30 ENCOUNTER — Emergency Department (HOSPITAL_COMMUNITY): Payer: Self-pay

## 2018-09-30 ENCOUNTER — Encounter (HOSPITAL_COMMUNITY): Payer: Self-pay | Admitting: Emergency Medicine

## 2018-09-30 ENCOUNTER — Emergency Department (HOSPITAL_COMMUNITY)
Admission: EM | Admit: 2018-09-30 | Discharge: 2018-09-30 | Disposition: A | Discharge status: Home / Self Care | Payer: Self-pay | Attending: Emergency Medicine | Admitting: Emergency Medicine

## 2018-09-30 ENCOUNTER — Other Ambulatory Visit: Payer: Self-pay

## 2018-09-30 DIAGNOSIS — D72829 Elevated white blood cell count, unspecified: Secondary | ICD-10-CM | POA: Insufficient documentation

## 2018-09-30 DIAGNOSIS — R9431 Abnormal electrocardiogram [ECG] [EKG]: Secondary | ICD-10-CM | POA: Insufficient documentation

## 2018-09-30 DIAGNOSIS — R112 Nausea with vomiting, unspecified: Secondary | ICD-10-CM

## 2018-09-30 DIAGNOSIS — F1290 Cannabis use, unspecified, uncomplicated: Secondary | ICD-10-CM | POA: Insufficient documentation

## 2018-09-30 DIAGNOSIS — F410 Panic disorder [episodic paroxysmal anxiety] without agoraphobia: Secondary | ICD-10-CM | POA: Insufficient documentation

## 2018-09-30 HISTORY — DX: Anxiety disorder, unspecified: F41.9

## 2018-09-30 LAB — URINALYSIS, ROUTINE W REFLEX MICROSCOPIC
Bacteria, UA: NONE SEEN
Bilirubin Urine: NEGATIVE
Glucose, UA: NEGATIVE mg/dL
Ketones, ur: 80 mg/dL — AB
Leukocytes,Ua: NEGATIVE
Nitrite: NEGATIVE
Protein, ur: NEGATIVE mg/dL
Specific Gravity, Urine: 1.023 (ref 1.005–1.030)
pH: 7 (ref 5.0–8.0)

## 2018-09-30 LAB — CBC WITH DIFFERENTIAL/PLATELET
Abs Immature Granulocytes: 0.09 10*3/uL — ABNORMAL HIGH (ref 0.00–0.07)
Basophils Absolute: 0.1 10*3/uL (ref 0.0–0.1)
Basophils Relative: 0 %
Eosinophils Absolute: 0 10*3/uL (ref 0.0–0.5)
Eosinophils Relative: 0 %
HCT: 44.1 % (ref 36.0–46.0)
Hemoglobin: 15.1 g/dL — ABNORMAL HIGH (ref 12.0–15.0)
Immature Granulocytes: 1 %
Lymphocytes Relative: 4 %
Lymphs Abs: 0.7 10*3/uL (ref 0.7–4.0)
MCH: 30.5 pg (ref 26.0–34.0)
MCHC: 34.2 g/dL (ref 30.0–36.0)
MCV: 89.1 fL (ref 80.0–100.0)
Monocytes Absolute: 1 10*3/uL (ref 0.1–1.0)
Monocytes Relative: 5 %
Neutro Abs: 16 10*3/uL — ABNORMAL HIGH (ref 1.7–7.7)
Neutrophils Relative %: 90 %
Platelets: 268 10*3/uL (ref 150–400)
RBC: 4.95 MIL/uL (ref 3.87–5.11)
RDW: 13.8 % (ref 11.5–15.5)
WBC: 17.8 10*3/uL — ABNORMAL HIGH (ref 4.0–10.5)
nRBC: 0 % (ref 0.0–0.2)

## 2018-09-30 LAB — BLOOD GAS, VENOUS
O2 Saturation: 81.6 %
Patient temperature: 98.6
pH, Ven: 7.548 — ABNORMAL HIGH (ref 7.250–7.430)
pO2, Ven: 41.6 mmHg (ref 32.0–45.0)

## 2018-09-30 LAB — I-STAT BETA HCG BLOOD, ED (MC, WL, AP ONLY): I-stat hCG, quantitative: <5 m[IU]/mL (ref <5)

## 2018-09-30 LAB — RAPID URINE DRUG SCREEN, HOSP PERFORMED
Amphetamines: NOT DETECTED
Barbiturates: NOT DETECTED
Benzodiazepines: NOT DETECTED
Cocaine: NOT DETECTED
Opiates: NOT DETECTED
Tetrahydrocannabinol: POSITIVE — AB

## 2018-09-30 LAB — COMPREHENSIVE METABOLIC PANEL
ALT: 16 U/L (ref 0–44)
AST: 28 U/L (ref 15–41)
Albumin: 5 g/dL (ref 3.5–5.0)
Alkaline Phosphatase: 62 U/L (ref 38–126)
Anion gap: 17 — ABNORMAL HIGH (ref 5–15)
BUN: 12 mg/dL (ref 6–20)
CO2: 14 mmol/L — ABNORMAL LOW (ref 22–32)
Calcium: 9.2 mg/dL (ref 8.9–10.3)
Chloride: 108 mmol/L (ref 98–111)
Creatinine, Ser: 0.64 mg/dL (ref 0.44–1.00)
GFR calc Af Amer: >60 mL/min (ref >60)
GFR calc non Af Amer: >60 mL/min (ref >60)
Glucose, Bld: 126 mg/dL — ABNORMAL HIGH (ref 70–99)
Potassium: 4.1 mmol/L (ref 3.5–5.1)
Sodium: 139 mmol/L (ref 135–145)
Total Bilirubin: 1.3 mg/dL — ABNORMAL HIGH (ref 0.3–1.2)
Total Protein: 8 g/dL (ref 6.5–8.1)

## 2018-09-30 LAB — LIPASE, BLOOD: Lipase: 24 U/L (ref 11–51)

## 2018-09-30 LAB — CK: Total CK: 55 U/L (ref 38–234)

## 2018-09-30 MED ORDER — SODIUM CHLORIDE 0.9 % IV BOLUS
1000.0000 mL | Freq: Once | INTRAVENOUS | Status: AC
  Administered 2018-09-30: 11:00:00 1000 mL via INTRAVENOUS

## 2018-09-30 MED ORDER — LORAZEPAM 2 MG/ML IJ SOLN
0.5000 mg | Freq: Once | INTRAMUSCULAR | Status: AC
  Administered 2018-09-30: 0.5 mg via INTRAVENOUS
  Filled 2018-09-30: qty 1

## 2018-09-30 MED ORDER — ALUM & MAG HYDROXIDE-SIMETH 200-200-20 MG/5ML PO SUSP
30.0000 mL | Freq: Once | ORAL | Status: AC
  Administered 2018-09-30: 30 mL via ORAL
  Filled 2018-09-30: qty 30

## 2018-09-30 MED ORDER — LORAZEPAM 1 MG PO TABS: 0.5000 mg | ORAL_TABLET | Freq: Three times a day (TID) | ORAL | 0 refills | Status: AC | PRN

## 2018-09-30 MED ORDER — LIDOCAINE VISCOUS HCL 2 % MT SOLN
15.0000 mL | Freq: Once | OROMUCOSAL | Status: AC
  Administered 2018-09-30: 15 mL via ORAL
  Filled 2018-09-30: qty 15

## 2018-09-30 MED ORDER — ONDANSETRON HCL 4 MG/2ML IJ SOLN
4.0000 mg | Freq: Once | INTRAMUSCULAR | Status: AC
  Administered 2018-09-30: 4 mg via INTRAVENOUS
  Filled 2018-09-30: qty 2

## 2018-09-30 MED ORDER — LORAZEPAM 2 MG/ML IJ SOLN
1.0000 mg | Freq: Once | INTRAMUSCULAR | Status: AC
  Administered 2018-09-30: 1 mg via INTRAVENOUS
  Filled 2018-09-30: qty 1

## 2018-09-30 MED ORDER — DICYCLOMINE HCL 10 MG/ML IM SOLN
20.0000 mg | Freq: Once | INTRAMUSCULAR | Status: AC
  Administered 2018-09-30: 20 mg via INTRAMUSCULAR
  Filled 2018-09-30: qty 2

## 2018-09-30 MED ORDER — FAMOTIDINE IN NACL 20-0.9 MG/50ML-% IV SOLN
20.0000 mg | Freq: Once | INTRAVENOUS | Status: AC
  Administered 2018-09-30: 14:00:00 20 mg via INTRAVENOUS
  Filled 2018-09-30: qty 50

## 2018-09-30 MED ORDER — SCOPOLAMINE 1 MG/3DAYS TD PT72
1.0000 | MEDICATED_PATCH | TRANSDERMAL | Status: DC
  Administered 2018-09-30: 17:00:00 1.5 mg via TRANSDERMAL
  Filled 2018-09-30 (×2): qty 1

## 2018-09-30 NOTE — ED Notes (Signed)
Provided pt with cup of ice. 

## 2018-09-30 NOTE — ED Notes (Signed)
Pt heard vomiting from hallway.  Pt did not tolerate ice chips and requested more zofran.  Notified PA of her request.

## 2018-09-30 NOTE — Discharge Instructions (Signed)
You can use ativan as needed for nausea and the patch behind your ear can stay in place for 3 days. Ativan can cause drowsiness do not take before driving.    Please try and decrease your marijuana use as it can contribute to these vomiting episodes.  You had a panic attack today, the Ativan prescribed can help with these as well.  Please follow-up with your primary care doctor for continued evaluation and treatment of your anxiety and panic attacks.  Return for fevers, worsening abdominal pain, persistent vomiting or any other new or concerning symptoms.

## 2018-09-30 NOTE — ED Triage Notes (Signed)
Patient arrived by EMS from home. Pt c/o N/V. Pt had dry heaves per EMS but no vomiting.   Hx of Anxiety, Panic Attacks.   Pt stated she had alcohol last night per EMS.

## 2018-09-30 NOTE — ED Notes (Signed)
Patient is unable to provide urine sample at this time.

## 2018-09-30 NOTE — ED Provider Notes (Signed)
[facility] DEPT Provider Note   CSN: [id number] Arrival date & time: 09/30/18  1007    History   Chief Complaint Chief Complaint  Patient presents with  . Anxiety  . Nausea    HPI Rebecca Nielsen is a 23 y.o. female.     Rebecca Nielsen is a 23 y.o. female with history of anxiety, who presents for evaluation of nausea and vomiting, as well as a panic attack.  Patient reports during the night she started having recurrent episodes of nausea and vomiting, no hematemesis.  She reports some associated generalized abdominal pain.  She denies any diarrhea, constipation, melena or hematochezia.  No dysuria, urinary frequency or flank pain.  No vaginal bleeding or vaginal discharge.  Patient reports that when vomiting persisted in the early morning she started to have a panic attack and has been hyperventilating and unable to calm down since then.  She reports that when she started panicking her leg started to feel numb and like she was unable to move them so she called EMS.  She reports using marijuana and alcohol prior to symptoms starting, denies any other drug use.  History of similar symptoms in the past.     Past Medical History:  Diagnosis Date  . Anxiety     There are no active problems to display for this patient.   Past Surgical History:  Procedure Laterality Date  . CARDIAC SURGERY       OB History   No obstetric history on file.      Home Medications    Prior to Admission medications   Medication Sig Start Date End Date Taking? Authorizing Provider  fluticasone (FLONASE) 50 MCG/ACT nasal spray Place 1 spray into both nostrils daily as needed for allergies.    Yes [provider]    Family History History reviewed. No pertinent family history.  Social History Social History   Tobacco Use  . Smoking status: Never Smoker  . Smokeless tobacco: Never Used  Substance Use Topics  . Alcohol use: Yes  . Drug use:  Not on file     Allergies   Patient has no known allergies.   Review of Systems Review of Systems  Constitutional: Negative for chills and fever.  HENT: Negative for congestion, rhinorrhea and sore throat.   Respiratory: Positive for chest tightness and shortness of breath. Negative for cough and wheezing.   Cardiovascular: Negative for chest pain.  Gastrointestinal: Positive for abdominal pain, nausea and vomiting. Negative for constipation and diarrhea.  Genitourinary: Negative for dysuria, flank pain, frequency and hematuria.  Musculoskeletal: Negative for arthralgias and myalgias.  Skin: Negative for color change and rash.  Neurological: Positive for numbness. Negative for dizziness, syncope, weakness and light-headedness.  Psychiatric/Behavioral: The patient is nervous/anxious.      Physical Exam Updated Vital Signs BP (!) 141/91   Pulse (!) 105   Temp (!) 97.5 F (36.4 C) (Oral)   Resp 16   SpO2 100%   Physical Exam Vitals signs and nursing note reviewed.  Constitutional:      General: She is not in acute distress.    Appearance: She is well-developed. She is not ill-appearing or diaphoretic.     Comments: Patient extremely anxious on arrival, hyperventilating and reporting she feels that her legs are numb, dry-heaving  HENT:     Head: Normocephalic and atraumatic.     Mouth/Throat:     Mouth: Mucous membranes are moist.     Pharynx:  Oropharynx is clear.  Eyes:     General:        Right eye: No discharge.        Left eye: No discharge.     Pupils: Pupils are equal, round, and reactive to light.  Neck:     Musculoskeletal: Neck supple.  Cardiovascular:     Rate and Rhythm: Normal rate and regular rhythm.     Heart sounds: Normal heart sounds.  Pulmonary:     Effort: No respiratory distress.     Breath sounds: No wheezing or rales.     Comments: Patient is tachypneic, RR 40, hyperventilating, but able to speak in full sentences, lungs clear to auscultation  without wheezing, rales or rhonchi. Abdominal:     General: Bowel sounds are normal. There is no distension.     Palpations: Abdomen is soft. There is no mass.     Tenderness: There is abdominal tenderness. There is no guarding.     Comments: Abdomen is soft and nondistended with bowel sounds present throughout, patient endorses generalized tenderness to palpation but there is no peritoneal signs or guarding.  Musculoskeletal:        General: No deformity.     Right lower leg: No edema.     Left lower leg: No edema.  Skin:    General: Skin is warm and dry.     Capillary Refill: Capillary refill takes less than 2 seconds.  Neurological:     Mental Status: She is alert.     Coordination: Coordination normal.     Comments: Speech is rapid, able to follow commands CN III-XII intact Patient states that she is unable to move her legs, moving upper extremities freely, when distracted patient is able to move both of her feet.  Reports numbness in her legs but also complains of pain.  Legs are warm and well perfused.   Psychiatric:        Mood and Affect: Mood is anxious. Affect is labile.     Comments: Patient hyperventilating and extremely anxious, appears to be having panic attack      ED Treatments / Results  Labs (all labs ordered are listed, but only abnormal results are displayed) Labs Reviewed  COMPREHENSIVE METABOLIC PANEL - Abnormal; Notable for the following components:      Result Value   CO2 14 (*)    Glucose, Bld 126 (*)    Total Bilirubin 1.3 (*)    Anion gap 17 (*)    All other components within normal limits  CBC WITH DIFFERENTIAL/PLATELET - Abnormal; Notable for the following components:   WBC 17.8 (*)    Hemoglobin 15.1 (*)    Neutro Abs 16.0 (*)    Abs Immature Granulocytes 0.09 (*)    All other components within normal limits  LIPASE, BLOOD  URINALYSIS, ROUTINE W REFLEX MICROSCOPIC  RAPID URINE DRUG SCREEN, HOSP PERFORMED  I-STAT BETA HCG BLOOD, ED ([facility], [facility],  [facility] ONLY)    EKG EKG Interpretation  Date/Time:  Tuesday September 30 2018 10:24:24 EDT Ventricular Rate:  106 PR Interval:    QRS Duration: 87 QT Interval:  376 QTC Calculation: 497 R Axis:   70 Text Interpretation:  Sinus tachycardia Borderline T wave abnormalities Prolonged QT interval poor bseline, similar to prior 12/18 Confirmed by Meridee ScoreButler, Michael [phone]([id number]) on 09/30/2018 11:13:28 AM   Radiology Dg Abdomen Acute W/chest  Result Date: 09/30/2018 CLINICAL DATA:  Nausea and vomiting. EXAM: DG ABDOMEN ACUTE W/ 1V CHEST COMPARISON:  Chest and abdominal x-rays dated March 03, 2017. CT abdomen pelvis dated October 16, 2016. FINDINGS: The cardiomediastinal silhouette is normal in size. Normal pulmonary vascularity. No focal consolidation, pleural effusion, or pneumothorax. No acute osseous abnormality. There is no evidence of dilated bowel loops or free intraperitoneal air. No radiopaque calculi or other significant radiographic abnormality is seen. Unchanged phleboliths in the pelvis. IMPRESSION: Negative abdominal radiographs.  No acute cardiopulmonary disease. Electronically Signed   By: Obie DredgeWilliam T Derry M.D.   On: 09/30/2018 12:14    Procedures Procedures (including critical care time)  Medications Ordered in ED Medications  sodium chloride 0.9 % bolus 1,000 mL (0 mLs Intravenous Stopped 09/30/18 1208)  ondansetron (ZOFRAN) injection 4 mg (4 mg Intravenous Given 09/30/18 1059)  dicyclomine (BENTYL) injection 20 mg (20 mg Intramuscular Given 09/30/18 1102)  LORazepam (ATIVAN) injection 1 mg (1 mg Intravenous Given 09/30/18 1101)  famotidine (PEPCID) IVPB 20 mg premix (0 mg Intravenous Stopped 09/30/18 1411)  alum & mag hydroxide-simeth (MAALOX/MYLANTA) 200-200-20 MG/5ML suspension 30 mL (30 mLs Oral Given 09/30/18 1335)    And  lidocaine (XYLOCAINE) 2 % viscous mouth solution 15 mL (15 mLs Oral Given 09/30/18 1335)  LORazepam (ATIVAN) injection 0.5 mg (0.5 mg Intravenous Given 09/30/18 1629)      Initial Impression / Assessment and Plan / ED Course  I have reviewed the triage vital signs and the nursing notes.  Pertinent labs & imaging results that were available during my care of the patient were reviewed by me and considered in my medical decision making (see chart for details).  Patient arrives complaining of persistent nausea and vomiting, on arrival patient also appears to be having a panic attack she has hyperventilating appears extremely anxious, with rapid speech, endorses feeling very anxious and panicked.  She is also complaining that she cannot feel or move her legs.  On arrival she is tachycardic to 105 with respiratory rate of 40, mildly hypertensive, but afebrile.  Patient was given 1 mg Ativan and her condition and vital significantly improved.  She now has normal sensation and movement in her lower extremities and respiratory rate and heart rate have returned to normal she is feeling much better.  She continues to have some dry heaving and complains of nausea and generalized abdominal pain.  She has some mild generalized tenderness on exam without peritonitis.  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  Patient's EKG does shows QTC of 497, will give 1 dose of Zofran, Ativan should help with vomiting as well will give other medications for symptomatic treatment.  Will check acute abdominal series and abdominal labs.  Patient wants white blood cell count of 17.8, hemoglobin of 15.1 suggesting some degree of hemoconcentration, I suspect leukocytosis is due to demargination from acute episode of vomiting, she is afebrile without peritoneal signs on abdominal exam.  Patient with CO2 of 14 and anion gap of 17, all other electrolytes normal, I suspect that this is due to hyperventilation from panic attack as it seems to have been prolonged and started before  patient arrived.  VBG consistent with the same.  Significantly improved after Ativan.  Lipase normal.  Normal renal and liver function.  Negative pregnancy, urinalysis without signs of infection.  UDS positive for THC, but otherwise negative.  Acute abdominal series reassuring with no evidence of abnormal gas pattern no free air, and no evidence of active  cardiopulmonary disease.  On reevaluation patient is overall feeling better.  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  She did have one additional episode of vomiting she was given an additional half milligram of Ativan and scopolamine patch and reports significant improvement in her nausea.  Will avoid prescribing any antiemetics due to borderline QTC with prolongation but will prescribe short course of Ativan which should help with further panic attacks as well as nausea and vomiting.  Strict return precautions discussed with patient.  She expresses understanding and agreement with plan.  Stable for discharge home in good condition.  Final Clinical Impressions(s) / ED Diagnoses   Final diagnoses:  Non-intractable vomiting with nausea, unspecified vomiting type  Panic attack  Leukocytosis, unspecified type  Marijuana use  QT prolongation    ED Discharge Orders         Ordered    LORazepam (ATIVAN) 1 MG tablet  3 times daily PRN     09/30/18 1700           Legrand RamsFord, Kannon Granderson N, PA-C 10/01/18 2229    Terrilee FilesButler, Michael C, MD 10/02/18 1044

## 2018-10-21 ENCOUNTER — Other Ambulatory Visit: Payer: Self-pay

## 2018-10-21 DIAGNOSIS — Z20822 Contact with and (suspected) exposure to covid-19: Secondary | ICD-10-CM

## 2018-10-22 LAB — NOVEL CORONAVIRUS, NAA: SARS-CoV-2, NAA: NOT DETECTED

## 2019-03-02 ENCOUNTER — Other Ambulatory Visit: Payer: Self-pay

## 2019-03-02 DIAGNOSIS — Z20822 Contact with and (suspected) exposure to covid-19: Secondary | ICD-10-CM

## 2019-03-04 LAB — NOVEL CORONAVIRUS, NAA: SARS-CoV-2, NAA: DETECTED — AB

## 2020-11-09 IMAGING — CR DG ABDOMEN ACUTE W/ 1V CHEST
4 series · 4 of 4 positions shown · non-contrast
Comparison: Chest and abdominal x-rays dated March 03, 2017. CT
abdomen pelvis dated October 16, 2016.

CLINICAL DATA: Nausea and vomiting.

EXAM:
DG ABDOMEN ACUTE W/ 1V CHEST

[w chest pa]
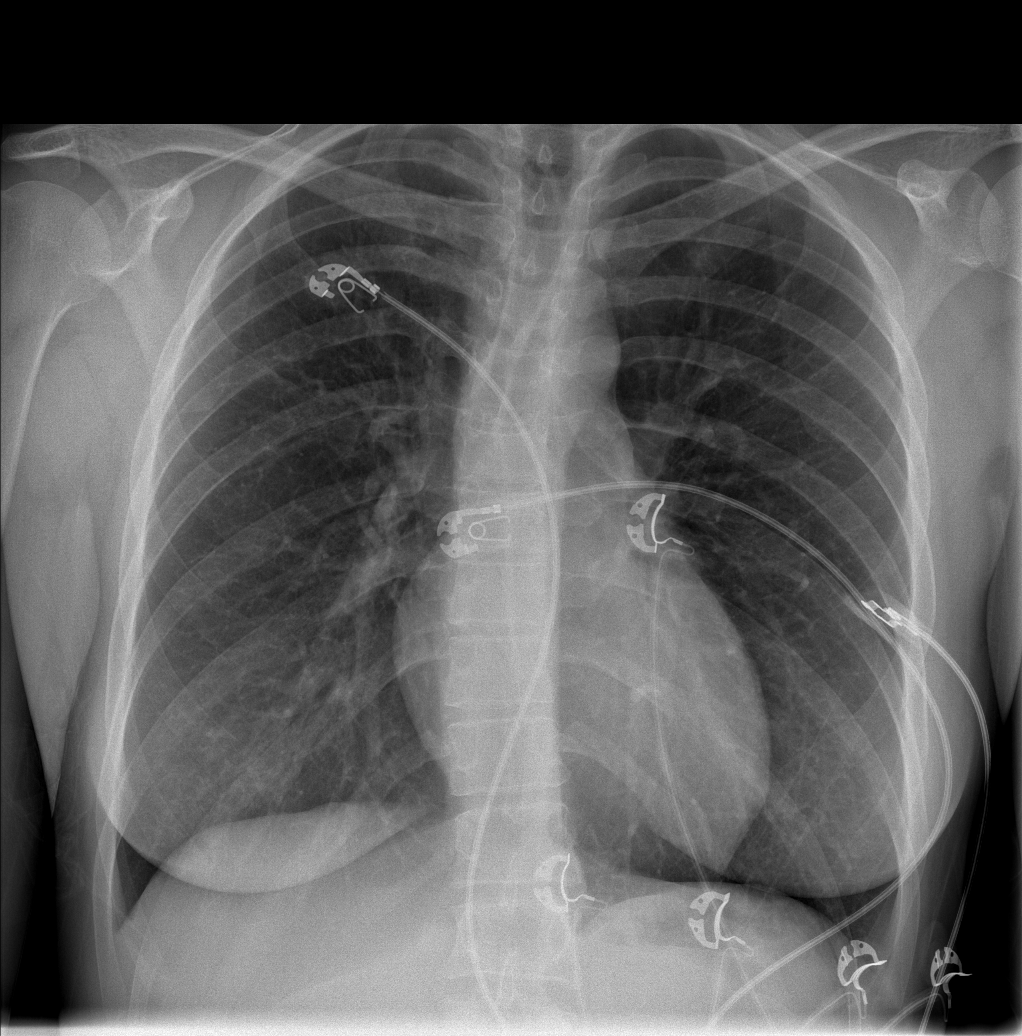

[w abdomen upright]
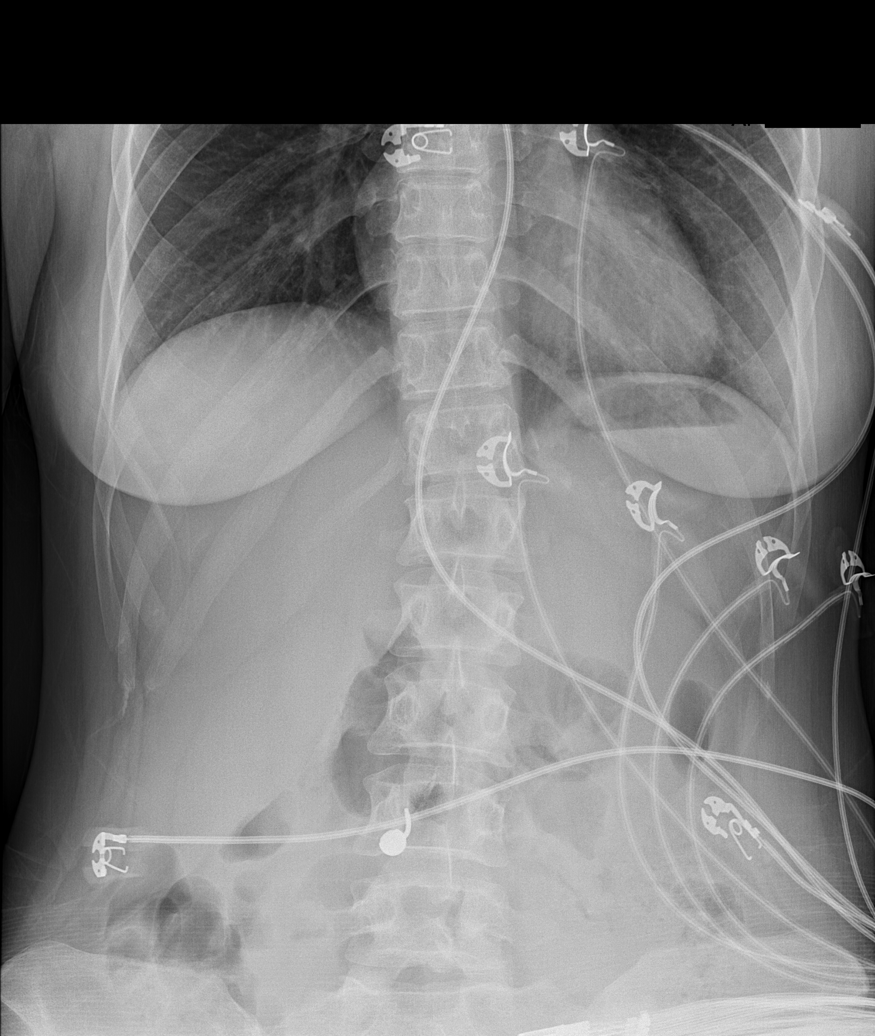

[x abdomen supine (1 of 2)]
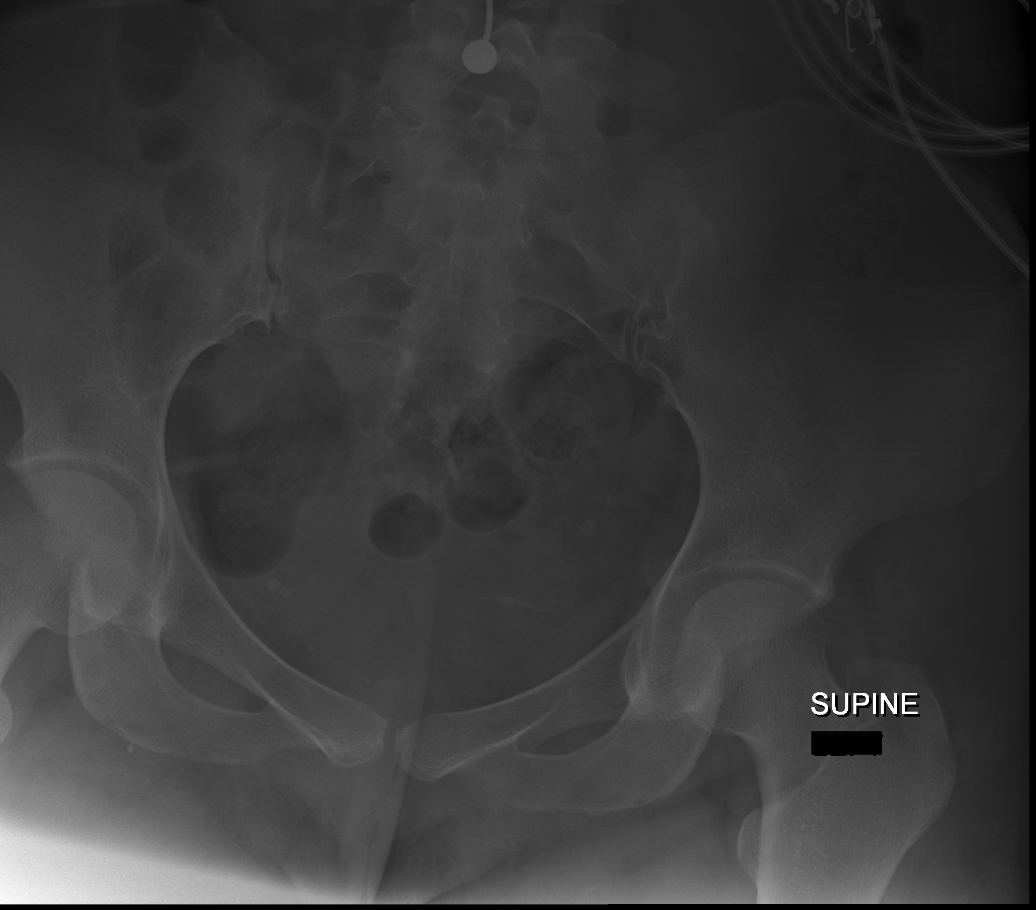

[x abdomen supine (2 of 2)]
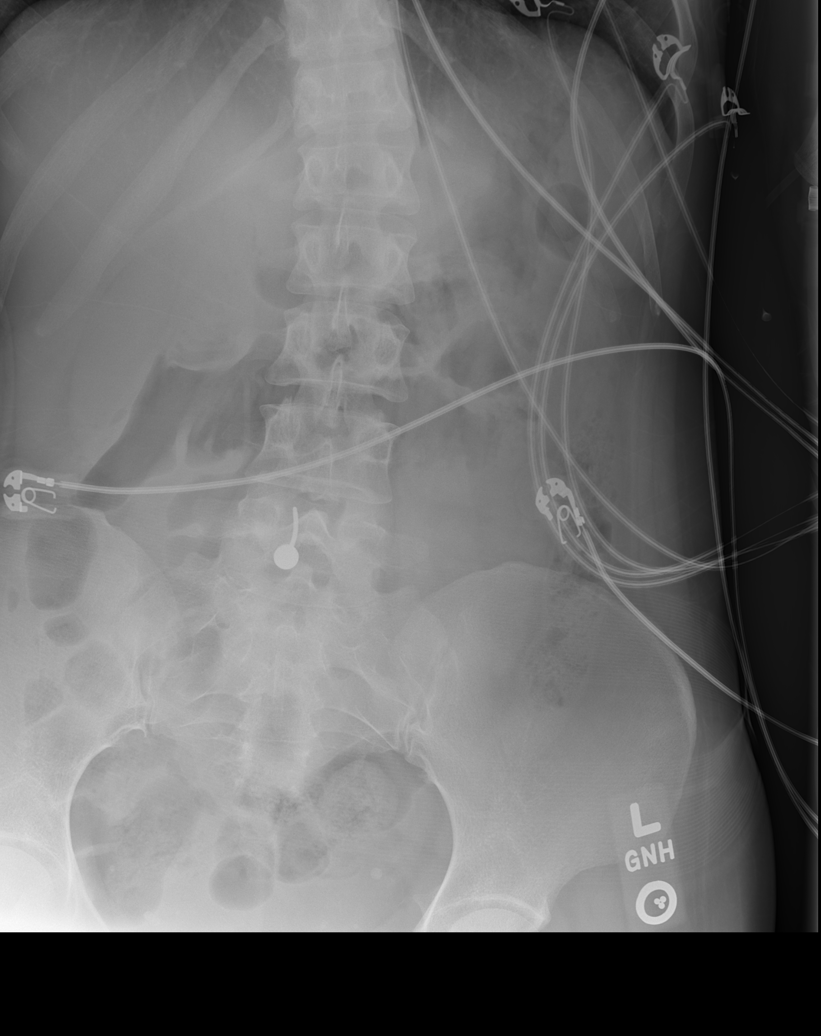

[4 of 4 positions shown; findings below may reference images not displayed]

FINDINGS: The cardiomediastinal silhouette is normal in size. Normal pulmonary
vascularity. No focal consolidation, pleural effusion, or
pneumothorax. No acute osseous abnormality.

There is no evidence of dilated bowel loops or free intraperitoneal
air. No radiopaque calculi or other significant radiographic
abnormality is seen. Unchanged phleboliths in the pelvis.
IMPRESSION: Negative abdominal radiographs.  No acute cardiopulmonary disease.

## 2022-04-13 ENCOUNTER — Telehealth: Payer: Self-pay

## 2022-04-13 NOTE — Telephone Encounter (Signed)
Mychart msg sent. AS, CMA
